# Patient Record
Sex: Female | Born: 1940 | Race: White | Hispanic: No
Health system: Southern US, Community
[De-identification: ages and names within clinical notes are randomized; demographics above are authoritative.]

---

## 1999-04-14 ENCOUNTER — Ambulatory Visit (HOSPITAL_BASED_OUTPATIENT_CLINIC_OR_DEPARTMENT_OTHER): Admission: RE | Admit: 1999-04-14 | Discharge: 1999-04-14 | Payer: Self-pay | Admitting: Orthopedic Surgery

## 1999-05-28 ENCOUNTER — Other Ambulatory Visit: Admission: RE | Admit: 1999-05-28 | Discharge: 1999-05-28 | Payer: Self-pay | Admitting: Family Medicine

## 2000-06-09 ENCOUNTER — Other Ambulatory Visit: Admission: RE | Admit: 2000-06-09 | Discharge: 2000-06-09 | Payer: Self-pay | Admitting: Family Medicine

## 2001-07-10 ENCOUNTER — Other Ambulatory Visit: Admission: RE | Admit: 2001-07-10 | Discharge: 2001-07-10 | Payer: Self-pay | Admitting: Family Medicine

## 2004-06-23 ENCOUNTER — Encounter (INDEPENDENT_AMBULATORY_CARE_PROVIDER_SITE_OTHER): Payer: Self-pay | Admitting: Specialist

## 2004-06-23 ENCOUNTER — Ambulatory Visit (HOSPITAL_COMMUNITY): Admission: RE | Admit: 2004-06-23 | Discharge: 2004-06-24 | Payer: Self-pay | Admitting: Surgery

## 2004-07-20 ENCOUNTER — Ambulatory Visit (HOSPITAL_COMMUNITY): Admission: RE | Admit: 2004-07-20 | Discharge: 2004-07-20 | Payer: Self-pay | Admitting: Gastroenterology

## 2005-08-10 ENCOUNTER — Other Ambulatory Visit: Admission: RE | Admit: 2005-08-10 | Discharge: 2005-08-10 | Payer: Self-pay | Admitting: Family Medicine

## 2006-09-01 ENCOUNTER — Other Ambulatory Visit: Admission: RE | Admit: 2006-09-01 | Discharge: 2006-09-01 | Payer: Self-pay | Admitting: Family Medicine

## 2007-10-30 ENCOUNTER — Other Ambulatory Visit: Admission: RE | Admit: 2007-10-30 | Discharge: 2007-10-30 | Payer: Self-pay | Admitting: Family Medicine

## 2008-09-02 ENCOUNTER — Other Ambulatory Visit: Admission: RE | Admit: 2008-09-02 | Discharge: 2008-09-02 | Payer: Self-pay | Admitting: Family Medicine

## 2010-12-03 NOTE — Op Note (Signed)
NAMEAdalyna, Andrea Payne                 ACCOUNT NO.:  [id number]   MEDICAL RECORD NO.:  [id number]          PATIENT TYPE:  AMB   LOCATION:  ENDO                         FACILITY:  [facility]   PHYSICIAN:  Graylin Shiver, M.D.   DATE OF BIRTH:  [birth year]   DATE OF PROCEDURE:  07/20/2004  DATE OF DISCHARGE:                                 OPERATIVE REPORT   PROCEDURE:  Esophagogastroduodenoscopy with biopsy for CLOtest.   INDICATION FOR PROCEDURE:  This patient is a 70 year old female who feels a  pulse or pounding sensation in the epigastric area.  This has been occurring  for the past three months.  She had recent cholecystectomy; however, this  did not improve the problem.  She still has the pounding sensation in the  epigastric area and a nervous feeling in the epigastric area.  She also had  some heartburn and was placed on Nexium, and this improved the heartburn.  A  CT scan of the abdomen was done on July 16, 2004, which showed  postoperative changes in the right upper quadrant without evidence of  inflammatory process of abscess.  There was a small right renal calculus  without evidence of obstruction.  The liver, spleen, and pancreas all looked  normal.  Endoscopy is being done to evaluate.   Informed consent was obtained after explanation of the risks of bleeding,  infection, and perforation.   PREMEDICATION:  Fentanyl 60 mcg IV, Versed 6 mg IV.   PROCEDURE:  With the patient in the left lateral decubitus position, the  Olympus gastroscope was inserted into the oropharynx and passed into the  esophagus.  It was advanced down the esophagus and then into the stomach and  into the duodenum.  The second portion and bulb of the duodenum were normal.  The stomach showed a mild diffuse erythematous appearance to the mucosa  compatible with gastritis.  No ulcers or erosions were seen.  No lesions  were seen in the fundus or cardia of the stomach.  I inspected for any  obvious  significant pulsations that could be seen, yet I saw none.  The  esophagus looked normal.  She tolerated the procedure well without  complications.   IMPRESSION:  Mild gastritis, biopsy for CLOtest was obtained.   COMMENT:  I see nothing other than gastritis on this upper endoscopy.  I see  nothing specifically on this examination or on reviewing her CT scan to  explain this pulsation-nervous sensation that she feels in the epigastric  area.   There is a right renal calculus without evidence of obstruction based on CT  scan.  I will have her follow up with Dr. Catha Gosselin for further  evaluation.       SFG/MEDQ  D:  07/20/2004  T:  07/20/2004  Job:  [id number]   cc:   Caryn Bee L. Little, M.D.  [address]  [place]  [place] [address]  Fax: [phone]

## 2010-12-03 NOTE — Op Note (Signed)
Andrea Payne, Andrea Payne                 ACCOUNT NO.:  [id number]   MEDICAL RECORD NO.:  [id number]          PATIENT TYPE:  OIB   LOCATION:  NA                           FACILITY:  [facility]   PHYSICIAN:  Currie Paris, M.D.DATE OF BIRTH:  17-Nov-1940   DATE OF PROCEDURE:  DATE OF DISCHARGE:                                 OPERATIVE REPORT   DATE OF OPERATION:  June 23, 2004.   PREOPERATIVE DIAGNOSIS:  Chronic calculous cholecystitis.   POSTOPERATIVE DIAGNOSIS:  Chronic calculous cholecystitis.   PROCEDURE:  Laparoscopic cholecystectomy with operative cholangiogram.   SURGEON:  Currie Paris, MD.   ASSISTANT:  Rose Phi. Young, MD.   ANESTHESIA:  General endotracheal.   INDICATION FOR PROCEDURE:  The patient is a 70 year old who presented last  week with ongoing right upper quadrant pain.  Ultrasound suggested a stone  impacted in the neck of the gallbladder.  After discussion with the patient,  she elected to proceed to cholecystectomy.   DESCRIPTION OF PROCEDURE:  The patient was seen in the holding area, and she  had no further questions.  She was taken to the operating room and after  satisfactory general anesthesia had been obtained, the abdomen was prepped  and draped.   0.25% plain Marcaine was used for each incision.  The umbilical incision was  made, the fascia opened, and the peritoneal cavity entered under direct  vision.  A pursestring was placed, the [place] introduced, and the abdomen  insufflated to 15.   The patient was placed in reverse Trendelenburg and tilted to the right.  Under direct vision, a 1011 trocar was placed in the epigastrium and two 5-  mm trocars in the lateral abdomen.  The gallbladder was retracted over the  liver, and multiple adhesions of omentum taken down.  We noted that the  duodenum was somewhat tethered up onto the gallbladder as well, but this was  gently manipulated off of the gallbladder.  A stone was seen impacted in the  ampulla.   With opening of the peritoneum on either side of the cystic duct, I could  identify the cystic artery coming up actually and laying anterior to the  cystic duct and attached to it.  I had a nice window behind there, so I was  confident of my anatomy and I could see the common duct and a cystic duct  segment and the junction with the gallbladder.   The cystic artery was clipped three times and divided, leaving two clips on  the stay side.  The cystic duct was clipped at its junction with the  gallbladder and then opened.   Using a percutaneous technique, a Cook catheter was introduced into the  cystic duct and held with a clip.  Operative cholangiography showed good  flow and filling of the duodenum, no filling defects in the common duct, and  good filling of the hepatic radicles.   The catheter was removed and two additional clips placed on the cystic duct,  and it was divided.  The gallbladder was removed from below to above with  the  coagulation current of the cautery.  Not mentioned earlier was the fact  that the gallbladder was very distended, and in order to grasp it at the  onset of the case we decompressed it.  White bile was noted in the  gallbladder.   Once the gallbladder was removed, it was placed in a bag, irrigated to make  sure everything was dry, and it appeared completely dry.  The gallbladder  was then brought out through the umbilical port.   The umbilical port was occluded for a few moments while we did a final  irrigation check for hemostasis, and again everything was dry.  Once the  gallbladder was removed, the lateral ports were removed under direct vision.  The pursestring was used to close the umbilical port.  The abdomen was  desufflated through the epigastric port.  The skin was closed with 4-0  Monocryl subcuticular and Dermabond.   The patient tolerated the procedure well.  There were no operative  complications, and all counts were  correct.      Chri   CJS/MEDQ  D:  06/23/2004  T:  06/23/2004  Job:  [id number]   cc:   Caryn Bee L. Little, M.D.  [address]  [place]  [place] [address]  Fax: [phone]

## 2011-09-08 DIAGNOSIS — M899 Disorder of bone, unspecified: Secondary | ICD-10-CM | POA: Diagnosis not present

## 2011-09-08 DIAGNOSIS — Z79899 Other long term (current) drug therapy: Secondary | ICD-10-CM | POA: Diagnosis not present

## 2011-09-08 DIAGNOSIS — R7301 Impaired fasting glucose: Secondary | ICD-10-CM | POA: Diagnosis not present

## 2011-09-08 DIAGNOSIS — E782 Mixed hyperlipidemia: Secondary | ICD-10-CM | POA: Diagnosis not present

## 2011-09-08 DIAGNOSIS — R030 Elevated blood-pressure reading, without diagnosis of hypertension: Secondary | ICD-10-CM | POA: Diagnosis not present

## 2011-09-08 DIAGNOSIS — M949 Disorder of cartilage, unspecified: Secondary | ICD-10-CM | POA: Diagnosis not present

## 2012-01-10 DIAGNOSIS — R7301 Impaired fasting glucose: Secondary | ICD-10-CM | POA: Diagnosis not present

## 2012-01-23 DIAGNOSIS — R071 Chest pain on breathing: Secondary | ICD-10-CM | POA: Diagnosis not present

## 2012-04-27 DIAGNOSIS — Z23 Encounter for immunization: Secondary | ICD-10-CM | POA: Diagnosis not present

## 2012-06-11 DIAGNOSIS — H5210 Myopia, unspecified eye: Secondary | ICD-10-CM | POA: Diagnosis not present

## 2012-06-11 DIAGNOSIS — H524 Presbyopia: Secondary | ICD-10-CM | POA: Diagnosis not present

## 2012-06-11 DIAGNOSIS — H2510 Age-related nuclear cataract, unspecified eye: Secondary | ICD-10-CM | POA: Diagnosis not present

## 2012-06-25 DIAGNOSIS — Z13820 Encounter for screening for osteoporosis: Secondary | ICD-10-CM | POA: Diagnosis not present

## 2012-06-25 DIAGNOSIS — Z1231 Encounter for screening mammogram for malignant neoplasm of breast: Secondary | ICD-10-CM | POA: Diagnosis not present

## 2012-06-25 DIAGNOSIS — Z8262 Family history of osteoporosis: Secondary | ICD-10-CM | POA: Diagnosis not present

## 2012-09-10 DIAGNOSIS — M899 Disorder of bone, unspecified: Secondary | ICD-10-CM | POA: Diagnosis not present

## 2012-09-10 DIAGNOSIS — M949 Disorder of cartilage, unspecified: Secondary | ICD-10-CM | POA: Diagnosis not present

## 2012-09-10 DIAGNOSIS — I10 Essential (primary) hypertension: Secondary | ICD-10-CM | POA: Diagnosis not present

## 2012-09-10 DIAGNOSIS — E782 Mixed hyperlipidemia: Secondary | ICD-10-CM | POA: Diagnosis not present

## 2012-09-10 DIAGNOSIS — R7301 Impaired fasting glucose: Secondary | ICD-10-CM | POA: Diagnosis not present

## 2013-05-06 DIAGNOSIS — Z23 Encounter for immunization: Secondary | ICD-10-CM | POA: Diagnosis not present

## 2013-06-17 DIAGNOSIS — H2510 Age-related nuclear cataract, unspecified eye: Secondary | ICD-10-CM | POA: Diagnosis not present

## 2013-07-22 DIAGNOSIS — Z803 Family history of malignant neoplasm of breast: Secondary | ICD-10-CM | POA: Diagnosis not present

## 2013-07-22 DIAGNOSIS — Z1231 Encounter for screening mammogram for malignant neoplasm of breast: Secondary | ICD-10-CM | POA: Diagnosis not present

## 2013-09-05 DIAGNOSIS — Z86010 Personal history of colonic polyps: Secondary | ICD-10-CM | POA: Diagnosis not present

## 2013-09-05 DIAGNOSIS — Z09 Encounter for follow-up examination after completed treatment for conditions other than malignant neoplasm: Secondary | ICD-10-CM | POA: Diagnosis not present

## 2013-09-20 DIAGNOSIS — M949 Disorder of cartilage, unspecified: Secondary | ICD-10-CM | POA: Diagnosis not present

## 2013-09-20 DIAGNOSIS — R7301 Impaired fasting glucose: Secondary | ICD-10-CM | POA: Diagnosis not present

## 2013-09-20 DIAGNOSIS — Z86010 Personal history of colonic polyps: Secondary | ICD-10-CM | POA: Diagnosis not present

## 2013-09-20 DIAGNOSIS — Z23 Encounter for immunization: Secondary | ICD-10-CM | POA: Diagnosis not present

## 2013-09-20 DIAGNOSIS — E782 Mixed hyperlipidemia: Secondary | ICD-10-CM | POA: Diagnosis not present

## 2013-09-20 DIAGNOSIS — I10 Essential (primary) hypertension: Secondary | ICD-10-CM | POA: Diagnosis not present

## 2013-09-20 DIAGNOSIS — M899 Disorder of bone, unspecified: Secondary | ICD-10-CM | POA: Diagnosis not present

## 2013-10-01 DIAGNOSIS — J069 Acute upper respiratory infection, unspecified: Secondary | ICD-10-CM | POA: Diagnosis not present

## 2013-10-01 DIAGNOSIS — J209 Acute bronchitis, unspecified: Secondary | ICD-10-CM | POA: Diagnosis not present

## 2014-05-13 DIAGNOSIS — Z23 Encounter for immunization: Secondary | ICD-10-CM | POA: Diagnosis not present

## 2014-07-24 DIAGNOSIS — H5213 Myopia, bilateral: Secondary | ICD-10-CM | POA: Diagnosis not present

## 2014-07-24 DIAGNOSIS — H2513 Age-related nuclear cataract, bilateral: Secondary | ICD-10-CM | POA: Diagnosis not present

## 2014-08-20 DIAGNOSIS — Z1231 Encounter for screening mammogram for malignant neoplasm of breast: Secondary | ICD-10-CM | POA: Diagnosis not present

## 2014-08-20 DIAGNOSIS — Z8262 Family history of osteoporosis: Secondary | ICD-10-CM | POA: Diagnosis not present

## 2014-08-20 DIAGNOSIS — Z803 Family history of malignant neoplasm of breast: Secondary | ICD-10-CM | POA: Diagnosis not present

## 2014-09-24 DIAGNOSIS — R7301 Impaired fasting glucose: Secondary | ICD-10-CM | POA: Diagnosis not present

## 2014-09-24 DIAGNOSIS — E782 Mixed hyperlipidemia: Secondary | ICD-10-CM | POA: Diagnosis not present

## 2014-09-24 DIAGNOSIS — Z86010 Personal history of colonic polyps: Secondary | ICD-10-CM | POA: Diagnosis not present

## 2014-09-24 DIAGNOSIS — I10 Essential (primary) hypertension: Secondary | ICD-10-CM | POA: Diagnosis not present

## 2014-09-24 DIAGNOSIS — M8580 Other specified disorders of bone density and structure, unspecified site: Secondary | ICD-10-CM | POA: Diagnosis not present

## 2015-01-20 ENCOUNTER — Telehealth: Payer: Self-pay

## 2015-01-20 NOTE — Telephone Encounter (Signed)
Error wrong chart

## 2015-04-28 DIAGNOSIS — Z23 Encounter for immunization: Secondary | ICD-10-CM | POA: Diagnosis not present

## 2015-07-22 DIAGNOSIS — D2372 Other benign neoplasm of skin of left lower limb, including hip: Secondary | ICD-10-CM | POA: Diagnosis not present

## 2015-07-22 DIAGNOSIS — L821 Other seborrheic keratosis: Secondary | ICD-10-CM | POA: Diagnosis not present

## 2015-07-22 DIAGNOSIS — Z23 Encounter for immunization: Secondary | ICD-10-CM | POA: Diagnosis not present

## 2015-08-10 DIAGNOSIS — H524 Presbyopia: Secondary | ICD-10-CM | POA: Diagnosis not present

## 2015-08-10 DIAGNOSIS — H2513 Age-related nuclear cataract, bilateral: Secondary | ICD-10-CM | POA: Diagnosis not present

## 2015-08-10 DIAGNOSIS — H25013 Cortical age-related cataract, bilateral: Secondary | ICD-10-CM | POA: Diagnosis not present

## 2015-09-16 DIAGNOSIS — Z803 Family history of malignant neoplasm of breast: Secondary | ICD-10-CM | POA: Diagnosis not present

## 2015-09-16 DIAGNOSIS — Z1231 Encounter for screening mammogram for malignant neoplasm of breast: Secondary | ICD-10-CM | POA: Diagnosis not present

## 2015-10-02 ENCOUNTER — Other Ambulatory Visit: Payer: Self-pay | Admitting: Family Medicine

## 2015-10-02 DIAGNOSIS — Z86010 Personal history of colonic polyps: Secondary | ICD-10-CM | POA: Diagnosis not present

## 2015-10-02 DIAGNOSIS — I10 Essential (primary) hypertension: Secondary | ICD-10-CM | POA: Diagnosis not present

## 2015-10-02 DIAGNOSIS — M899 Disorder of bone, unspecified: Secondary | ICD-10-CM | POA: Diagnosis not present

## 2015-10-02 DIAGNOSIS — J309 Allergic rhinitis, unspecified: Secondary | ICD-10-CM | POA: Diagnosis not present

## 2015-10-02 DIAGNOSIS — R7301 Impaired fasting glucose: Secondary | ICD-10-CM | POA: Diagnosis not present

## 2015-10-02 DIAGNOSIS — R938 Abnormal findings on diagnostic imaging of other specified body structures: Secondary | ICD-10-CM | POA: Diagnosis not present

## 2015-10-02 DIAGNOSIS — E782 Mixed hyperlipidemia: Secondary | ICD-10-CM | POA: Diagnosis not present

## 2015-10-02 DIAGNOSIS — R9389 Abnormal findings on diagnostic imaging of other specified body structures: Secondary | ICD-10-CM

## 2015-10-15 ENCOUNTER — Ambulatory Visit
Admission: RE | Admit: 2015-10-15 | Discharge: 2015-10-15 | Disposition: A | Discharge status: Home / Self Care | Payer: Medicare Other | Source: Ambulatory Visit | Attending: Family Medicine | Admitting: Family Medicine

## 2015-10-15 DIAGNOSIS — I6523 Occlusion and stenosis of bilateral carotid arteries: Secondary | ICD-10-CM | POA: Diagnosis not present

## 2015-10-15 DIAGNOSIS — R9389 Abnormal findings on diagnostic imaging of other specified body structures: Secondary | ICD-10-CM

## 2015-11-26 DIAGNOSIS — D485 Neoplasm of uncertain behavior of skin: Secondary | ICD-10-CM | POA: Diagnosis not present

## 2015-11-26 DIAGNOSIS — H02834 Dermatochalasis of left upper eyelid: Secondary | ICD-10-CM | POA: Diagnosis not present

## 2015-11-26 DIAGNOSIS — H02831 Dermatochalasis of right upper eyelid: Secondary | ICD-10-CM | POA: Diagnosis not present

## 2015-12-31 DIAGNOSIS — D485 Neoplasm of uncertain behavior of skin: Secondary | ICD-10-CM | POA: Diagnosis not present

## 2015-12-31 DIAGNOSIS — D210 Benign neoplasm of connective and other soft tissue of head, face and neck: Secondary | ICD-10-CM | POA: Diagnosis not present

## 2015-12-31 DIAGNOSIS — D1801 Hemangioma of skin and subcutaneous tissue: Secondary | ICD-10-CM | POA: Diagnosis not present

## 2016-03-31 DIAGNOSIS — L821 Other seborrheic keratosis: Secondary | ICD-10-CM | POA: Diagnosis not present

## 2016-03-31 DIAGNOSIS — L820 Inflamed seborrheic keratosis: Secondary | ICD-10-CM | POA: Diagnosis not present

## 2016-03-31 DIAGNOSIS — D485 Neoplasm of uncertain behavior of skin: Secondary | ICD-10-CM | POA: Diagnosis not present

## 2016-05-24 DIAGNOSIS — Z23 Encounter for immunization: Secondary | ICD-10-CM | POA: Diagnosis not present

## 2016-09-05 DIAGNOSIS — H2513 Age-related nuclear cataract, bilateral: Secondary | ICD-10-CM | POA: Diagnosis not present

## 2016-09-05 DIAGNOSIS — H524 Presbyopia: Secondary | ICD-10-CM | POA: Diagnosis not present

## 2016-09-05 DIAGNOSIS — H5213 Myopia, bilateral: Secondary | ICD-10-CM | POA: Diagnosis not present

## 2016-10-03 DIAGNOSIS — Z1231 Encounter for screening mammogram for malignant neoplasm of breast: Secondary | ICD-10-CM | POA: Diagnosis not present

## 2016-10-20 DIAGNOSIS — Z8739 Personal history of other diseases of the musculoskeletal system and connective tissue: Secondary | ICD-10-CM | POA: Diagnosis not present

## 2016-10-20 DIAGNOSIS — R7301 Impaired fasting glucose: Secondary | ICD-10-CM | POA: Diagnosis not present

## 2016-10-20 DIAGNOSIS — I10 Essential (primary) hypertension: Secondary | ICD-10-CM | POA: Diagnosis not present

## 2016-10-20 DIAGNOSIS — Z86010 Personal history of colonic polyps: Secondary | ICD-10-CM | POA: Diagnosis not present

## 2016-10-20 DIAGNOSIS — E782 Mixed hyperlipidemia: Secondary | ICD-10-CM | POA: Diagnosis not present

## 2016-10-20 DIAGNOSIS — M899 Disorder of bone, unspecified: Secondary | ICD-10-CM | POA: Diagnosis not present

## 2017-05-09 DIAGNOSIS — I10 Essential (primary) hypertension: Secondary | ICD-10-CM | POA: Diagnosis not present

## 2017-05-09 DIAGNOSIS — Z23 Encounter for immunization: Secondary | ICD-10-CM | POA: Diagnosis not present

## 2017-05-09 DIAGNOSIS — E782 Mixed hyperlipidemia: Secondary | ICD-10-CM | POA: Diagnosis not present

## 2017-05-09 DIAGNOSIS — R7301 Impaired fasting glucose: Secondary | ICD-10-CM | POA: Diagnosis not present

## 2017-05-09 DIAGNOSIS — Z8739 Personal history of other diseases of the musculoskeletal system and connective tissue: Secondary | ICD-10-CM | POA: Diagnosis not present

## 2017-05-09 DIAGNOSIS — M899 Disorder of bone, unspecified: Secondary | ICD-10-CM | POA: Diagnosis not present

## 2017-05-09 DIAGNOSIS — Z86010 Personal history of colonic polyps: Secondary | ICD-10-CM | POA: Diagnosis not present

## 2017-08-21 DIAGNOSIS — R05 Cough: Secondary | ICD-10-CM | POA: Diagnosis not present

## 2017-08-21 DIAGNOSIS — H938X3 Other specified disorders of ear, bilateral: Secondary | ICD-10-CM | POA: Diagnosis not present

## 2017-08-21 DIAGNOSIS — J0110 Acute frontal sinusitis, unspecified: Secondary | ICD-10-CM | POA: Diagnosis not present

## 2017-09-21 DIAGNOSIS — H40023 Open angle with borderline findings, high risk, bilateral: Secondary | ICD-10-CM | POA: Diagnosis not present

## 2017-09-21 DIAGNOSIS — H2513 Age-related nuclear cataract, bilateral: Secondary | ICD-10-CM | POA: Diagnosis not present

## 2017-10-06 DIAGNOSIS — H40023 Open angle with borderline findings, high risk, bilateral: Secondary | ICD-10-CM | POA: Diagnosis not present

## 2017-10-10 DIAGNOSIS — Z1231 Encounter for screening mammogram for malignant neoplasm of breast: Secondary | ICD-10-CM | POA: Diagnosis not present

## 2017-10-25 DIAGNOSIS — L821 Other seborrheic keratosis: Secondary | ICD-10-CM | POA: Diagnosis not present

## 2017-10-25 DIAGNOSIS — D1801 Hemangioma of skin and subcutaneous tissue: Secondary | ICD-10-CM | POA: Diagnosis not present

## 2017-10-25 DIAGNOSIS — L820 Inflamed seborrheic keratosis: Secondary | ICD-10-CM | POA: Diagnosis not present

## 2017-10-25 DIAGNOSIS — L918 Other hypertrophic disorders of the skin: Secondary | ICD-10-CM | POA: Diagnosis not present

## 2017-11-01 DIAGNOSIS — I10 Essential (primary) hypertension: Secondary | ICD-10-CM | POA: Diagnosis not present

## 2017-11-01 DIAGNOSIS — F339 Major depressive disorder, recurrent, unspecified: Secondary | ICD-10-CM | POA: Diagnosis not present

## 2017-11-01 DIAGNOSIS — Z86010 Personal history of colonic polyps: Secondary | ICD-10-CM | POA: Diagnosis not present

## 2017-11-01 DIAGNOSIS — R7301 Impaired fasting glucose: Secondary | ICD-10-CM | POA: Diagnosis not present

## 2017-11-01 DIAGNOSIS — E782 Mixed hyperlipidemia: Secondary | ICD-10-CM | POA: Diagnosis not present

## 2018-02-06 DIAGNOSIS — F339 Major depressive disorder, recurrent, unspecified: Secondary | ICD-10-CM | POA: Diagnosis not present

## 2018-02-06 DIAGNOSIS — I10 Essential (primary) hypertension: Secondary | ICD-10-CM | POA: Diagnosis not present

## 2018-02-06 DIAGNOSIS — Z86010 Personal history of colonic polyps: Secondary | ICD-10-CM | POA: Diagnosis not present

## 2018-02-06 DIAGNOSIS — R7301 Impaired fasting glucose: Secondary | ICD-10-CM | POA: Diagnosis not present

## 2018-02-06 DIAGNOSIS — E782 Mixed hyperlipidemia: Secondary | ICD-10-CM | POA: Diagnosis not present

## 2018-03-20 DIAGNOSIS — B029 Zoster without complications: Secondary | ICD-10-CM | POA: Diagnosis not present

## 2018-03-27 DIAGNOSIS — B029 Zoster without complications: Secondary | ICD-10-CM | POA: Diagnosis not present

## 2018-04-09 DIAGNOSIS — H40023 Open angle with borderline findings, high risk, bilateral: Secondary | ICD-10-CM | POA: Diagnosis not present

## 2018-11-30 DIAGNOSIS — H524 Presbyopia: Secondary | ICD-10-CM | POA: Diagnosis not present

## 2018-11-30 DIAGNOSIS — H2513 Age-related nuclear cataract, bilateral: Secondary | ICD-10-CM | POA: Diagnosis not present

## 2018-11-30 DIAGNOSIS — H5213 Myopia, bilateral: Secondary | ICD-10-CM | POA: Diagnosis not present

## 2018-11-30 DIAGNOSIS — H40023 Open angle with borderline findings, high risk, bilateral: Secondary | ICD-10-CM | POA: Diagnosis not present

## 2019-01-02 DIAGNOSIS — J309 Allergic rhinitis, unspecified: Secondary | ICD-10-CM | POA: Diagnosis not present

## 2019-01-02 DIAGNOSIS — E782 Mixed hyperlipidemia: Secondary | ICD-10-CM | POA: Diagnosis not present

## 2019-01-02 DIAGNOSIS — I10 Essential (primary) hypertension: Secondary | ICD-10-CM | POA: Diagnosis not present

## 2019-01-02 DIAGNOSIS — Z86010 Personal history of colonic polyps: Secondary | ICD-10-CM | POA: Diagnosis not present

## 2019-01-02 DIAGNOSIS — R7301 Impaired fasting glucose: Secondary | ICD-10-CM | POA: Diagnosis not present

## 2019-02-13 DIAGNOSIS — D225 Melanocytic nevi of trunk: Secondary | ICD-10-CM | POA: Diagnosis not present

## 2019-02-13 DIAGNOSIS — L821 Other seborrheic keratosis: Secondary | ICD-10-CM | POA: Diagnosis not present

## 2019-02-13 DIAGNOSIS — D1801 Hemangioma of skin and subcutaneous tissue: Secondary | ICD-10-CM | POA: Diagnosis not present

## 2019-02-13 DIAGNOSIS — L84 Corns and callosities: Secondary | ICD-10-CM | POA: Diagnosis not present

## 2019-02-20 DIAGNOSIS — Z1231 Encounter for screening mammogram for malignant neoplasm of breast: Secondary | ICD-10-CM | POA: Diagnosis not present

## 2019-04-04 DIAGNOSIS — I10 Essential (primary) hypertension: Secondary | ICD-10-CM | POA: Diagnosis not present

## 2019-04-04 DIAGNOSIS — R7301 Impaired fasting glucose: Secondary | ICD-10-CM | POA: Diagnosis not present

## 2019-04-04 DIAGNOSIS — E782 Mixed hyperlipidemia: Secondary | ICD-10-CM | POA: Diagnosis not present

## 2019-04-04 DIAGNOSIS — Z86010 Personal history of colonic polyps: Secondary | ICD-10-CM | POA: Diagnosis not present

## 2019-04-04 DIAGNOSIS — J309 Allergic rhinitis, unspecified: Secondary | ICD-10-CM | POA: Diagnosis not present

## 2019-04-23 DIAGNOSIS — Z23 Encounter for immunization: Secondary | ICD-10-CM | POA: Diagnosis not present

## 2019-06-03 DIAGNOSIS — H40023 Open angle with borderline findings, high risk, bilateral: Secondary | ICD-10-CM | POA: Diagnosis not present

## 2019-09-25 DIAGNOSIS — H6122 Impacted cerumen, left ear: Secondary | ICD-10-CM | POA: Diagnosis not present

## 2019-09-25 DIAGNOSIS — H9311 Tinnitus, right ear: Secondary | ICD-10-CM | POA: Diagnosis not present

## 2019-09-25 DIAGNOSIS — I10 Essential (primary) hypertension: Secondary | ICD-10-CM | POA: Diagnosis not present

## 2019-10-22 DIAGNOSIS — H903 Sensorineural hearing loss, bilateral: Secondary | ICD-10-CM | POA: Diagnosis not present

## 2019-10-22 DIAGNOSIS — H9113 Presbycusis, bilateral: Secondary | ICD-10-CM | POA: Diagnosis not present

## 2019-10-22 DIAGNOSIS — H9311 Tinnitus, right ear: Secondary | ICD-10-CM | POA: Diagnosis not present

## 2019-11-23 DIAGNOSIS — L03116 Cellulitis of left lower limb: Secondary | ICD-10-CM | POA: Diagnosis not present

## 2019-11-23 DIAGNOSIS — W57XXXA Bitten or stung by nonvenomous insect and other nonvenomous arthropods, initial encounter: Secondary | ICD-10-CM | POA: Diagnosis not present

## 2019-11-23 DIAGNOSIS — S70369A Insect bite (nonvenomous), unspecified thigh, initial encounter: Secondary | ICD-10-CM | POA: Diagnosis not present

## 2019-12-26 DIAGNOSIS — H40013 Open angle with borderline findings, low risk, bilateral: Secondary | ICD-10-CM | POA: Diagnosis not present

## 2019-12-26 DIAGNOSIS — H2513 Age-related nuclear cataract, bilateral: Secondary | ICD-10-CM | POA: Diagnosis not present

## 2020-01-30 ENCOUNTER — Other Ambulatory Visit: Payer: Self-pay | Admitting: Family Medicine

## 2020-01-30 DIAGNOSIS — Z86010 Personal history of colonic polyps: Secondary | ICD-10-CM | POA: Diagnosis not present

## 2020-01-30 DIAGNOSIS — Z8739 Personal history of other diseases of the musculoskeletal system and connective tissue: Secondary | ICD-10-CM | POA: Diagnosis not present

## 2020-01-30 DIAGNOSIS — I779 Disorder of arteries and arterioles, unspecified: Secondary | ICD-10-CM | POA: Diagnosis not present

## 2020-01-30 DIAGNOSIS — R7301 Impaired fasting glucose: Secondary | ICD-10-CM | POA: Diagnosis not present

## 2020-01-30 DIAGNOSIS — Z23 Encounter for immunization: Secondary | ICD-10-CM | POA: Diagnosis not present

## 2020-01-30 DIAGNOSIS — E782 Mixed hyperlipidemia: Secondary | ICD-10-CM | POA: Diagnosis not present

## 2020-01-30 DIAGNOSIS — Z0000 Encounter for general adult medical examination without abnormal findings: Secondary | ICD-10-CM | POA: Diagnosis not present

## 2020-01-30 DIAGNOSIS — I10 Essential (primary) hypertension: Secondary | ICD-10-CM | POA: Diagnosis not present

## 2020-02-05 ENCOUNTER — Ambulatory Visit
Admission: RE | Admit: 2020-02-05 | Discharge: 2020-02-05 | Disposition: A | Discharge status: Home / Self Care | Payer: Medicare Other | Source: Ambulatory Visit | Attending: Family Medicine | Admitting: Family Medicine

## 2020-02-05 DIAGNOSIS — I6523 Occlusion and stenosis of bilateral carotid arteries: Secondary | ICD-10-CM | POA: Diagnosis not present

## 2020-02-05 DIAGNOSIS — I779 Disorder of arteries and arterioles, unspecified: Secondary | ICD-10-CM

## 2020-02-05 DIAGNOSIS — I771 Stricture of artery: Secondary | ICD-10-CM | POA: Diagnosis not present

## 2020-03-09 DIAGNOSIS — D1801 Hemangioma of skin and subcutaneous tissue: Secondary | ICD-10-CM | POA: Diagnosis not present

## 2020-03-09 DIAGNOSIS — L821 Other seborrheic keratosis: Secondary | ICD-10-CM | POA: Diagnosis not present

## 2020-03-09 DIAGNOSIS — L918 Other hypertrophic disorders of the skin: Secondary | ICD-10-CM | POA: Diagnosis not present

## 2020-03-09 DIAGNOSIS — D2272 Melanocytic nevi of left lower limb, including hip: Secondary | ICD-10-CM | POA: Diagnosis not present

## 2020-03-25 DIAGNOSIS — Z1231 Encounter for screening mammogram for malignant neoplasm of breast: Secondary | ICD-10-CM | POA: Diagnosis not present

## 2020-03-31 DIAGNOSIS — R0981 Nasal congestion: Secondary | ICD-10-CM | POA: Diagnosis not present

## 2020-03-31 DIAGNOSIS — R5383 Other fatigue: Secondary | ICD-10-CM | POA: Diagnosis not present

## 2020-03-31 DIAGNOSIS — R05 Cough: Secondary | ICD-10-CM | POA: Diagnosis not present

## 2020-04-16 DIAGNOSIS — Z8616 Personal history of COVID-19: Secondary | ICD-10-CM | POA: Diagnosis not present

## 2020-04-27 DIAGNOSIS — Z23 Encounter for immunization: Secondary | ICD-10-CM | POA: Diagnosis not present

## 2020-06-04 DIAGNOSIS — Z23 Encounter for immunization: Secondary | ICD-10-CM | POA: Diagnosis not present

## 2020-08-20 DIAGNOSIS — R7301 Impaired fasting glucose: Secondary | ICD-10-CM | POA: Diagnosis not present

## 2020-08-20 DIAGNOSIS — R059 Cough, unspecified: Secondary | ICD-10-CM | POA: Diagnosis not present

## 2020-08-20 DIAGNOSIS — I779 Disorder of arteries and arterioles, unspecified: Secondary | ICD-10-CM | POA: Diagnosis not present

## 2020-08-20 DIAGNOSIS — Z86010 Personal history of colonic polyps: Secondary | ICD-10-CM | POA: Diagnosis not present

## 2020-08-20 DIAGNOSIS — I10 Essential (primary) hypertension: Secondary | ICD-10-CM | POA: Diagnosis not present

## 2020-08-20 DIAGNOSIS — E782 Mixed hyperlipidemia: Secondary | ICD-10-CM | POA: Diagnosis not present

## 2020-08-20 DIAGNOSIS — Z23 Encounter for immunization: Secondary | ICD-10-CM | POA: Diagnosis not present

## 2020-08-20 DIAGNOSIS — Z8739 Personal history of other diseases of the musculoskeletal system and connective tissue: Secondary | ICD-10-CM | POA: Diagnosis not present

## 2020-08-20 DIAGNOSIS — R5383 Other fatigue: Secondary | ICD-10-CM | POA: Diagnosis not present

## 2020-08-20 DIAGNOSIS — R0981 Nasal congestion: Secondary | ICD-10-CM | POA: Diagnosis not present

## 2020-09-21 DIAGNOSIS — R1310 Dysphagia, unspecified: Secondary | ICD-10-CM | POA: Diagnosis not present

## 2020-09-21 DIAGNOSIS — R140 Abdominal distension (gaseous): Secondary | ICD-10-CM | POA: Diagnosis not present

## 2020-09-21 DIAGNOSIS — Z86010 Personal history of colonic polyps: Secondary | ICD-10-CM | POA: Diagnosis not present

## 2020-10-28 DIAGNOSIS — D122 Benign neoplasm of ascending colon: Secondary | ICD-10-CM | POA: Diagnosis not present

## 2020-10-28 DIAGNOSIS — Z86010 Personal history of colonic polyps: Secondary | ICD-10-CM | POA: Diagnosis not present

## 2020-11-04 DIAGNOSIS — D122 Benign neoplasm of ascending colon: Secondary | ICD-10-CM | POA: Diagnosis not present

## 2020-12-08 DIAGNOSIS — H25813 Combined forms of age-related cataract, bilateral: Secondary | ICD-10-CM | POA: Diagnosis not present

## 2021-02-01 DIAGNOSIS — Z1389 Encounter for screening for other disorder: Secondary | ICD-10-CM | POA: Diagnosis not present

## 2021-02-01 DIAGNOSIS — Z0000 Encounter for general adult medical examination without abnormal findings: Secondary | ICD-10-CM | POA: Diagnosis not present

## 2021-02-02 DIAGNOSIS — I10 Essential (primary) hypertension: Secondary | ICD-10-CM | POA: Diagnosis not present

## 2021-02-02 DIAGNOSIS — R7301 Impaired fasting glucose: Secondary | ICD-10-CM | POA: Diagnosis not present

## 2021-02-02 DIAGNOSIS — S46819A Strain of other muscles, fascia and tendons at shoulder and upper arm level, unspecified arm, initial encounter: Secondary | ICD-10-CM | POA: Diagnosis not present

## 2021-02-02 DIAGNOSIS — E782 Mixed hyperlipidemia: Secondary | ICD-10-CM | POA: Diagnosis not present

## 2021-02-02 DIAGNOSIS — I779 Disorder of arteries and arterioles, unspecified: Secondary | ICD-10-CM | POA: Diagnosis not present

## 2021-02-02 DIAGNOSIS — H6990 Unspecified Eustachian tube disorder, unspecified ear: Secondary | ICD-10-CM | POA: Diagnosis not present

## 2021-02-16 DIAGNOSIS — L821 Other seborrheic keratosis: Secondary | ICD-10-CM | POA: Diagnosis not present

## 2021-02-16 DIAGNOSIS — D485 Neoplasm of uncertain behavior of skin: Secondary | ICD-10-CM | POA: Diagnosis not present

## 2021-03-16 DIAGNOSIS — L821 Other seborrheic keratosis: Secondary | ICD-10-CM | POA: Diagnosis not present

## 2021-03-16 DIAGNOSIS — D1801 Hemangioma of skin and subcutaneous tissue: Secondary | ICD-10-CM | POA: Diagnosis not present

## 2021-03-16 DIAGNOSIS — L570 Actinic keratosis: Secondary | ICD-10-CM | POA: Diagnosis not present

## 2021-04-13 DIAGNOSIS — M25511 Pain in right shoulder: Secondary | ICD-10-CM | POA: Diagnosis not present

## 2021-05-15 DIAGNOSIS — Z23 Encounter for immunization: Secondary | ICD-10-CM | POA: Diagnosis not present

## 2021-05-27 DIAGNOSIS — Z1231 Encounter for screening mammogram for malignant neoplasm of breast: Secondary | ICD-10-CM | POA: Diagnosis not present

## 2021-05-27 DIAGNOSIS — Z780 Asymptomatic menopausal state: Secondary | ICD-10-CM | POA: Diagnosis not present

## 2021-08-05 DIAGNOSIS — R7301 Impaired fasting glucose: Secondary | ICD-10-CM | POA: Diagnosis not present

## 2021-10-08 IMAGING — US US CAROTID DUPLEX BILAT
1 series · 13 of 24 positions shown · non-contrast
Comparison: 10/15/2015

CLINICAL DATA: 78-year-old female with a history of carotid artery
disease

EXAM:
BILATERAL CAROTID DUPLEX ULTRASOUND
TECHNIQUE: Gray scale imaging, color Doppler and duplex ultrasound were
performed of bilateral carotid and vertebral arteries in the neck.

[Series 1: us carotid duplex bilat · 0.06mm/px · 13 of 58 slices shown]
[im 1/58]
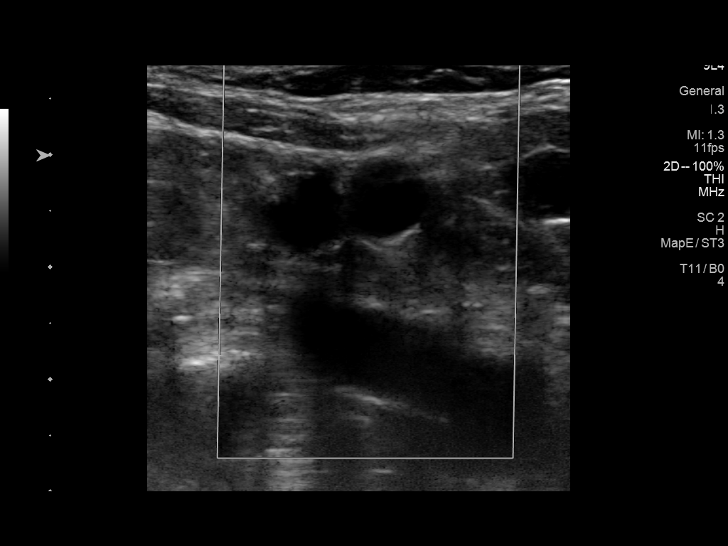
[im 5/58]
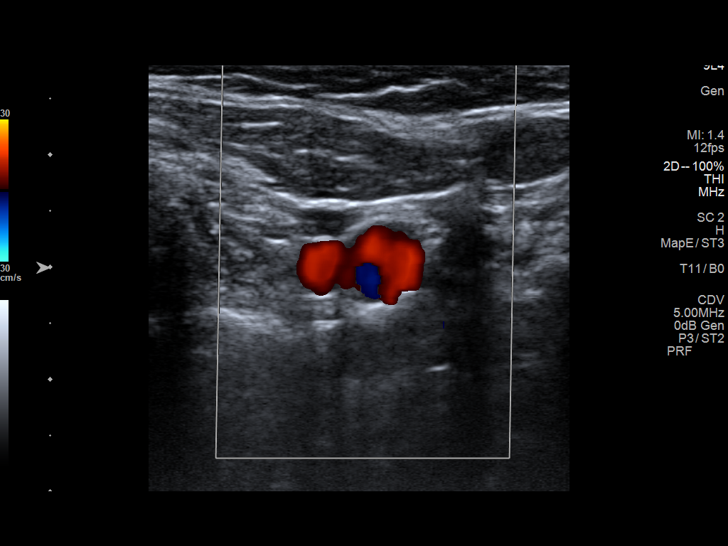
[im 10/58]
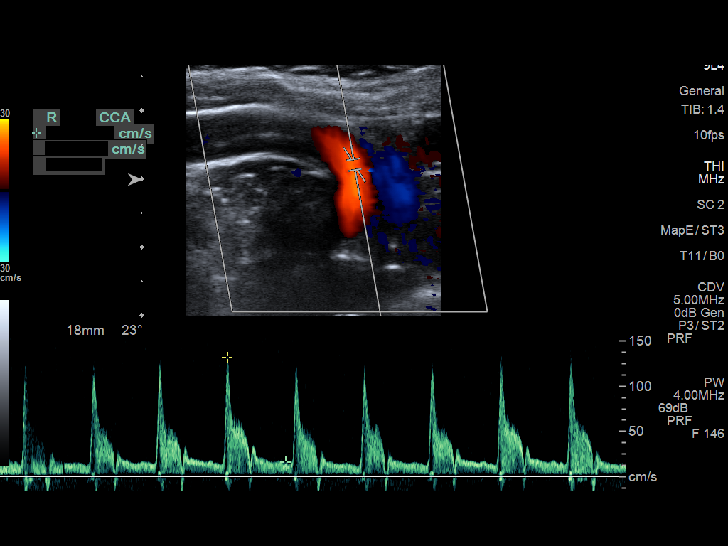
[im 15/58]
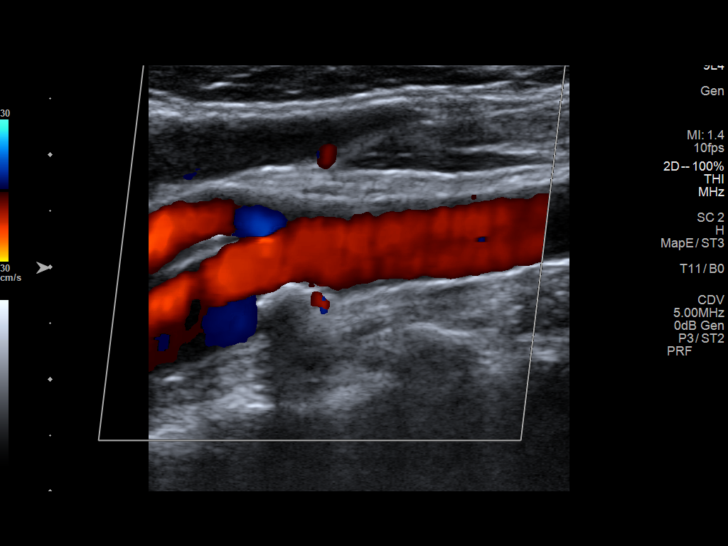
[im 20/58]
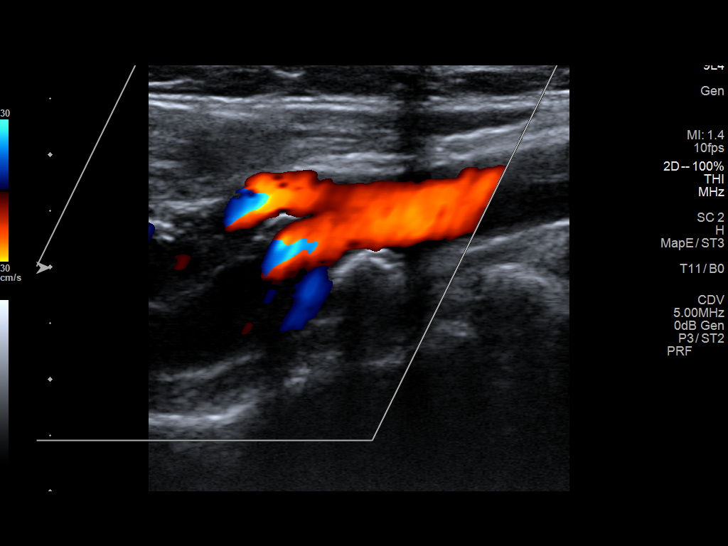
[im 25/58]
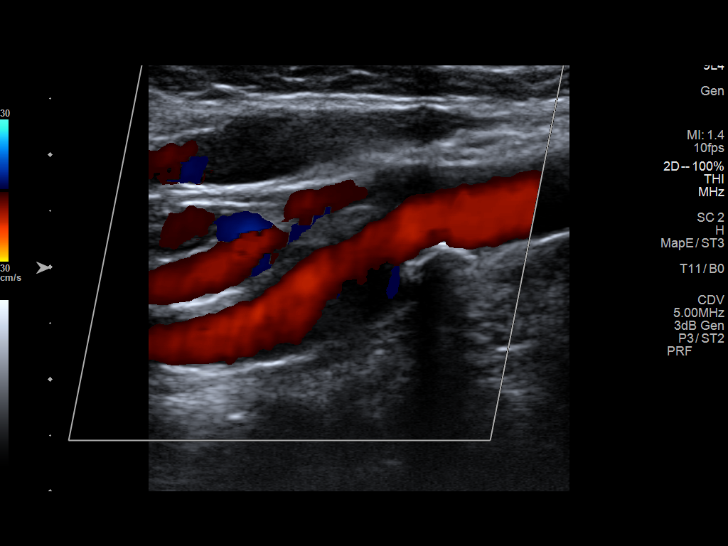
[im 30/58]
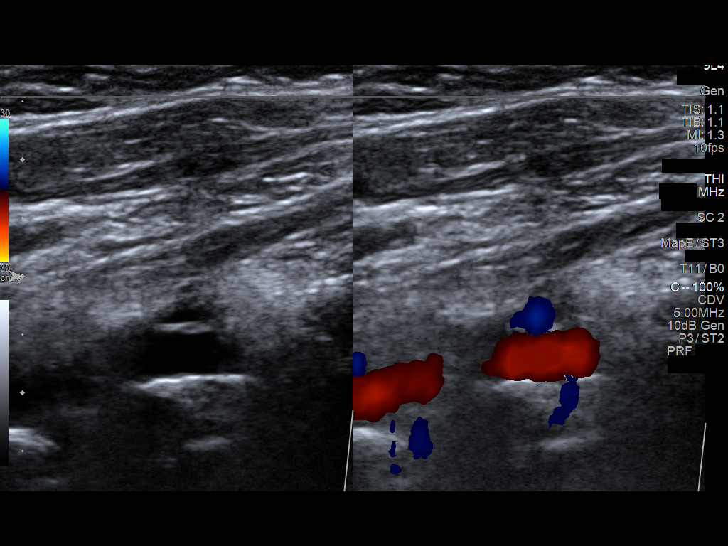
[im 33/58]
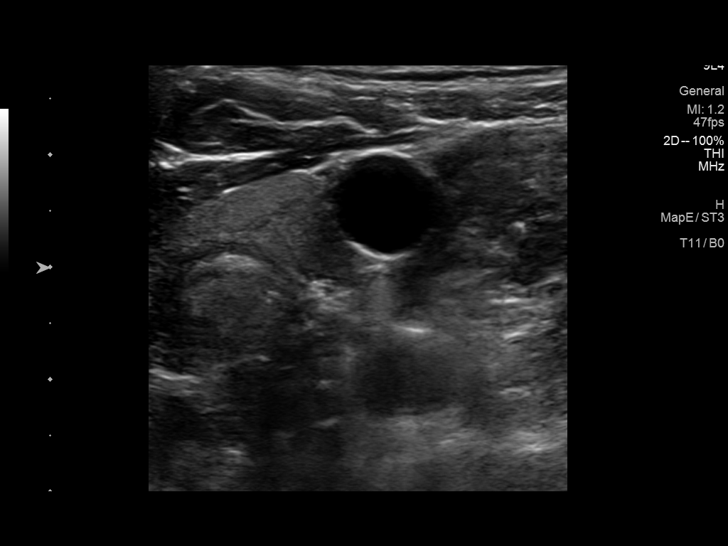
[im 38/58]
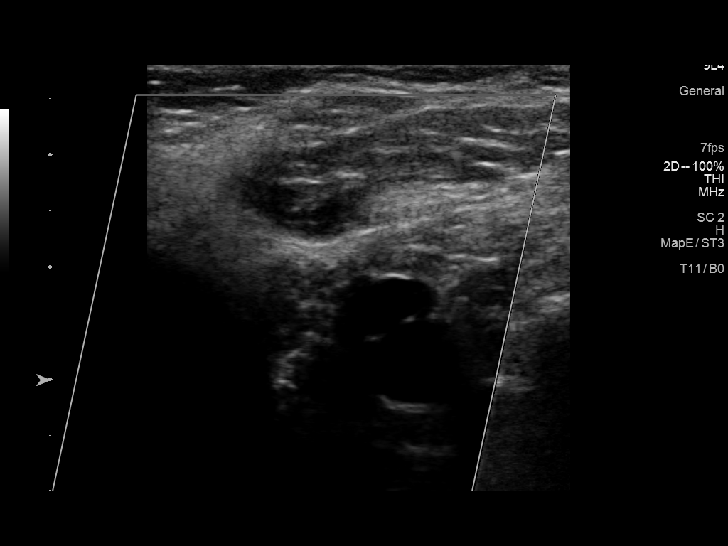
[im 43/58]
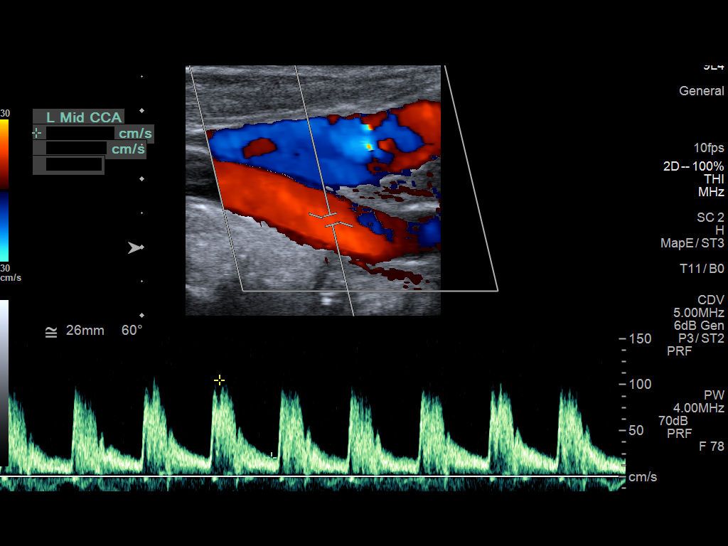
[im 48/58]
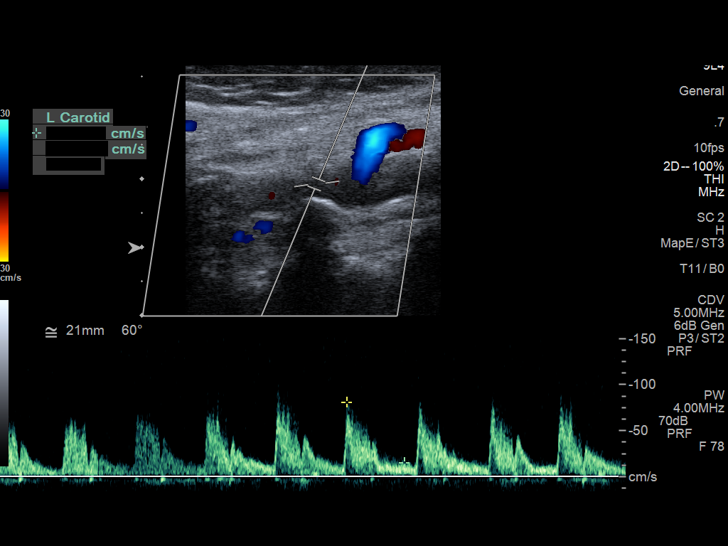
[im 53/58]
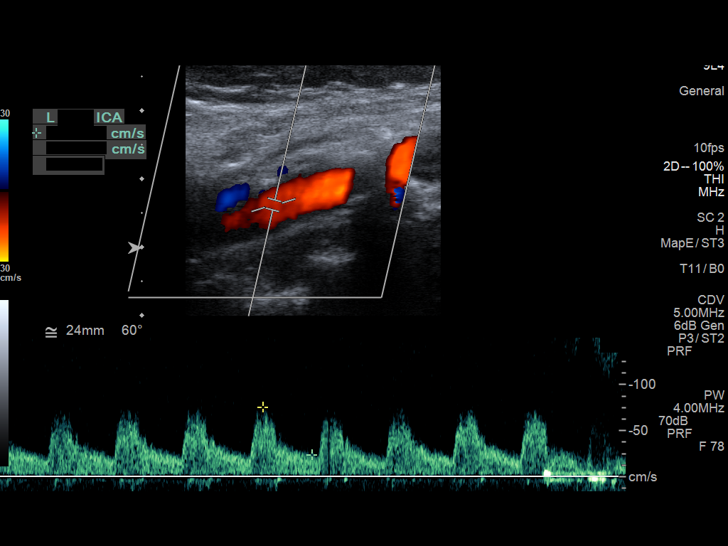
[im 58/58]
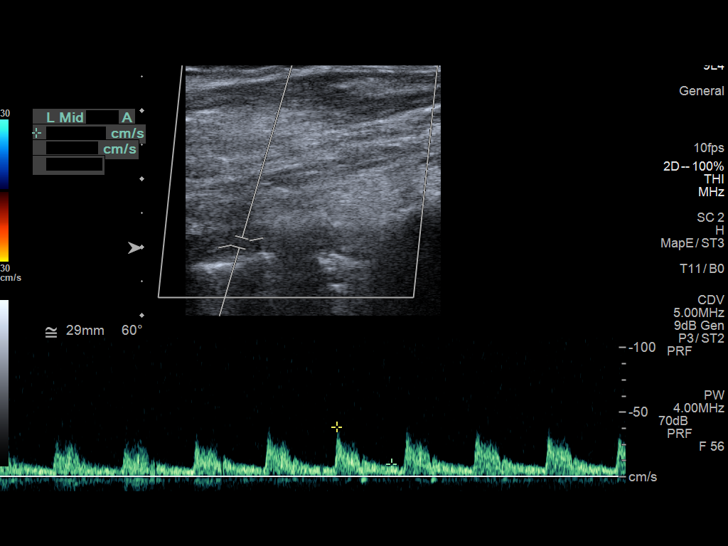

[13 of 24 positions shown; findings below may reference images not displayed]

FINDINGS: Criteria: Quantification of carotid stenosis is based on velocity
parameters that correlate the residual internal carotid diameter
with NASCET-based stenosis levels, using the diameter of the distal
internal carotid lumen as the denominator for stenosis measurement.

The following velocity measurements were obtained:

RIGHT

ICA:  Systolic 107 cm/sec, Diastolic 21 cm/sec

CCA:  145 cm/sec

SYSTOLIC ICA/CCA RATIO:

ECA:  91 cm/sec

LEFT

ICA:  Systolic 87 cm/sec, Diastolic 23 cm/sec

CCA:  105 cm/sec

SYSTOLIC ICA/CCA RATIO:

ECA:  90 cm/sec

Right Brachial SBP: 170

Left Brachial SBP: 176

RIGHT CAROTID ARTERY: No significant calcifications of the right
common carotid artery. Intermediate waveform maintained.
Heterogeneous and partially calcified plaque at the right carotid
bifurcation. No significant lumen shadowing. Low resistance waveform
of the right ICA. Tortuosity

RIGHT VERTEBRAL ARTERY: Antegrade flow with low resistance waveform.

LEFT CAROTID ARTERY: No significant calcifications of the left
common carotid artery. Intermediate waveform maintained.
Heterogeneous and partially calcified plaque at the left carotid
bifurcation without significant lumen shadowing. Low resistance
waveform of the left ICA. Tortuosity

LEFT VERTEBRAL ARTERY:  Antegrade flow with low resistance waveform.
IMPRESSION: Color duplex indicates minimal heterogeneous and calcified plaque,
with no hemodynamically significant stenosis by duplex criteria in
the extracranial cerebrovascular circulation.

## 2021-10-26 DIAGNOSIS — I10 Essential (primary) hypertension: Secondary | ICD-10-CM | POA: Diagnosis not present

## 2021-10-26 DIAGNOSIS — R7301 Impaired fasting glucose: Secondary | ICD-10-CM | POA: Diagnosis not present

## 2021-10-26 DIAGNOSIS — E782 Mixed hyperlipidemia: Secondary | ICD-10-CM | POA: Diagnosis not present

## 2021-10-26 DIAGNOSIS — R7303 Prediabetes: Secondary | ICD-10-CM | POA: Diagnosis not present

## 2021-11-23 DIAGNOSIS — H40013 Open angle with borderline findings, low risk, bilateral: Secondary | ICD-10-CM | POA: Diagnosis not present

## 2021-11-23 DIAGNOSIS — R7303 Prediabetes: Secondary | ICD-10-CM | POA: Diagnosis not present

## 2021-11-23 DIAGNOSIS — H25813 Combined forms of age-related cataract, bilateral: Secondary | ICD-10-CM | POA: Diagnosis not present

## 2022-02-03 DIAGNOSIS — R7303 Prediabetes: Secondary | ICD-10-CM | POA: Diagnosis not present

## 2022-02-03 DIAGNOSIS — Z0000 Encounter for general adult medical examination without abnormal findings: Secondary | ICD-10-CM | POA: Diagnosis not present

## 2022-02-03 DIAGNOSIS — F339 Major depressive disorder, recurrent, unspecified: Secondary | ICD-10-CM | POA: Diagnosis not present

## 2022-02-03 DIAGNOSIS — F411 Generalized anxiety disorder: Secondary | ICD-10-CM | POA: Diagnosis not present

## 2022-02-03 DIAGNOSIS — Z86010 Personal history of colonic polyps: Secondary | ICD-10-CM | POA: Diagnosis not present

## 2022-02-03 DIAGNOSIS — E782 Mixed hyperlipidemia: Secondary | ICD-10-CM | POA: Diagnosis not present

## 2022-02-03 DIAGNOSIS — I10 Essential (primary) hypertension: Secondary | ICD-10-CM | POA: Diagnosis not present

## 2022-02-03 DIAGNOSIS — K219 Gastro-esophageal reflux disease without esophagitis: Secondary | ICD-10-CM | POA: Diagnosis not present

## 2022-02-03 DIAGNOSIS — I779 Disorder of arteries and arterioles, unspecified: Secondary | ICD-10-CM | POA: Diagnosis not present

## 2022-04-25 DIAGNOSIS — D2272 Melanocytic nevi of left lower limb, including hip: Secondary | ICD-10-CM | POA: Diagnosis not present

## 2022-04-25 DIAGNOSIS — L821 Other seborrheic keratosis: Secondary | ICD-10-CM | POA: Diagnosis not present

## 2022-05-03 DIAGNOSIS — Z23 Encounter for immunization: Secondary | ICD-10-CM | POA: Diagnosis not present

## 2022-05-31 DIAGNOSIS — Z1231 Encounter for screening mammogram for malignant neoplasm of breast: Secondary | ICD-10-CM | POA: Diagnosis not present

## 2022-07-14 DIAGNOSIS — H11441 Conjunctival cysts, right eye: Secondary | ICD-10-CM | POA: Diagnosis not present

## 2022-08-03 DIAGNOSIS — Z6823 Body mass index (BMI) 23.0-23.9, adult: Secondary | ICD-10-CM | POA: Diagnosis not present

## 2022-08-03 DIAGNOSIS — F411 Generalized anxiety disorder: Secondary | ICD-10-CM | POA: Diagnosis not present

## 2022-08-03 DIAGNOSIS — R7303 Prediabetes: Secondary | ICD-10-CM | POA: Diagnosis not present

## 2022-08-03 DIAGNOSIS — K219 Gastro-esophageal reflux disease without esophagitis: Secondary | ICD-10-CM | POA: Diagnosis not present

## 2022-08-03 DIAGNOSIS — F339 Major depressive disorder, recurrent, unspecified: Secondary | ICD-10-CM | POA: Diagnosis not present

## 2022-08-03 DIAGNOSIS — I10 Essential (primary) hypertension: Secondary | ICD-10-CM | POA: Diagnosis not present

## 2022-08-03 DIAGNOSIS — E782 Mixed hyperlipidemia: Secondary | ICD-10-CM | POA: Diagnosis not present

## 2022-08-03 DIAGNOSIS — Z79899 Other long term (current) drug therapy: Secondary | ICD-10-CM | POA: Diagnosis not present

## 2022-08-03 DIAGNOSIS — I779 Disorder of arteries and arterioles, unspecified: Secondary | ICD-10-CM | POA: Diagnosis not present

## 2022-09-06 DIAGNOSIS — Z6824 Body mass index (BMI) 24.0-24.9, adult: Secondary | ICD-10-CM | POA: Diagnosis not present

## 2022-09-06 DIAGNOSIS — Z79899 Other long term (current) drug therapy: Secondary | ICD-10-CM | POA: Diagnosis not present

## 2022-09-06 DIAGNOSIS — D485 Neoplasm of uncertain behavior of skin: Secondary | ICD-10-CM | POA: Diagnosis not present

## 2022-09-06 DIAGNOSIS — I10 Essential (primary) hypertension: Secondary | ICD-10-CM | POA: Diagnosis not present

## 2022-09-06 DIAGNOSIS — B078 Other viral warts: Secondary | ICD-10-CM | POA: Diagnosis not present

## 2022-09-06 DIAGNOSIS — L821 Other seborrheic keratosis: Secondary | ICD-10-CM | POA: Diagnosis not present

## 2022-11-22 DIAGNOSIS — H25813 Combined forms of age-related cataract, bilateral: Secondary | ICD-10-CM | POA: Diagnosis not present

## 2022-11-22 DIAGNOSIS — H5213 Myopia, bilateral: Secondary | ICD-10-CM | POA: Diagnosis not present

## 2022-11-22 DIAGNOSIS — H524 Presbyopia: Secondary | ICD-10-CM | POA: Diagnosis not present

## 2022-11-22 DIAGNOSIS — H52203 Unspecified astigmatism, bilateral: Secondary | ICD-10-CM | POA: Diagnosis not present

## 2022-11-22 DIAGNOSIS — H40003 Preglaucoma, unspecified, bilateral: Secondary | ICD-10-CM | POA: Diagnosis not present

## 2023-02-06 DIAGNOSIS — Z6824 Body mass index (BMI) 24.0-24.9, adult: Secondary | ICD-10-CM | POA: Diagnosis not present

## 2023-02-06 DIAGNOSIS — Z0000 Encounter for general adult medical examination without abnormal findings: Secondary | ICD-10-CM | POA: Diagnosis not present

## 2023-03-14 DIAGNOSIS — Z6824 Body mass index (BMI) 24.0-24.9, adult: Secondary | ICD-10-CM | POA: Diagnosis not present

## 2023-03-14 DIAGNOSIS — M791 Myalgia, unspecified site: Secondary | ICD-10-CM | POA: Diagnosis not present

## 2023-03-14 DIAGNOSIS — E782 Mixed hyperlipidemia: Secondary | ICD-10-CM | POA: Diagnosis not present

## 2023-03-14 DIAGNOSIS — F411 Generalized anxiety disorder: Secondary | ICD-10-CM | POA: Diagnosis not present

## 2023-03-14 DIAGNOSIS — E1159 Type 2 diabetes mellitus with other circulatory complications: Secondary | ICD-10-CM | POA: Diagnosis not present

## 2023-03-14 DIAGNOSIS — I10 Essential (primary) hypertension: Secondary | ICD-10-CM | POA: Diagnosis not present

## 2023-05-08 DIAGNOSIS — L820 Inflamed seborrheic keratosis: Secondary | ICD-10-CM | POA: Diagnosis not present

## 2023-05-08 DIAGNOSIS — D485 Neoplasm of uncertain behavior of skin: Secondary | ICD-10-CM | POA: Diagnosis not present

## 2023-05-16 DIAGNOSIS — Z23 Encounter for immunization: Secondary | ICD-10-CM | POA: Diagnosis not present

## 2023-06-05 DIAGNOSIS — Z1231 Encounter for screening mammogram for malignant neoplasm of breast: Secondary | ICD-10-CM | POA: Diagnosis not present

## 2023-09-13 DIAGNOSIS — M5441 Lumbago with sciatica, right side: Secondary | ICD-10-CM | POA: Diagnosis not present

## 2023-09-13 DIAGNOSIS — E782 Mixed hyperlipidemia: Secondary | ICD-10-CM | POA: Diagnosis not present

## 2023-09-13 DIAGNOSIS — E1159 Type 2 diabetes mellitus with other circulatory complications: Secondary | ICD-10-CM | POA: Diagnosis not present

## 2023-09-13 DIAGNOSIS — M5442 Lumbago with sciatica, left side: Secondary | ICD-10-CM | POA: Diagnosis not present

## 2023-09-13 DIAGNOSIS — I10 Essential (primary) hypertension: Secondary | ICD-10-CM | POA: Diagnosis not present

## 2023-09-13 DIAGNOSIS — G8929 Other chronic pain: Secondary | ICD-10-CM | POA: Diagnosis not present

## 2023-10-13 DIAGNOSIS — I10 Essential (primary) hypertension: Secondary | ICD-10-CM | POA: Diagnosis not present

## 2023-11-02 DIAGNOSIS — L918 Other hypertrophic disorders of the skin: Secondary | ICD-10-CM | POA: Diagnosis not present

## 2023-11-02 DIAGNOSIS — L570 Actinic keratosis: Secondary | ICD-10-CM | POA: Diagnosis not present

## 2023-11-02 DIAGNOSIS — D2272 Melanocytic nevi of left lower limb, including hip: Secondary | ICD-10-CM | POA: Diagnosis not present

## 2023-11-02 DIAGNOSIS — L821 Other seborrheic keratosis: Secondary | ICD-10-CM | POA: Diagnosis not present

## 2023-11-27 DIAGNOSIS — H47393 Other disorders of optic disc, bilateral: Secondary | ICD-10-CM | POA: Diagnosis not present

## 2023-11-27 DIAGNOSIS — H524 Presbyopia: Secondary | ICD-10-CM | POA: Diagnosis not present

## 2023-11-27 DIAGNOSIS — H5213 Myopia, bilateral: Secondary | ICD-10-CM | POA: Diagnosis not present

## 2023-11-27 DIAGNOSIS — H52203 Unspecified astigmatism, bilateral: Secondary | ICD-10-CM | POA: Diagnosis not present

## 2023-11-27 DIAGNOSIS — H25013 Cortical age-related cataract, bilateral: Secondary | ICD-10-CM | POA: Diagnosis not present

## 2023-11-27 DIAGNOSIS — H2513 Age-related nuclear cataract, bilateral: Secondary | ICD-10-CM | POA: Diagnosis not present

## 2023-11-27 DIAGNOSIS — H40003 Preglaucoma, unspecified, bilateral: Secondary | ICD-10-CM | POA: Diagnosis not present

## 2024-02-02 DIAGNOSIS — M419 Scoliosis, unspecified: Secondary | ICD-10-CM | POA: Diagnosis not present

## 2024-02-02 DIAGNOSIS — M5135 Other intervertebral disc degeneration, thoracolumbar region: Secondary | ICD-10-CM | POA: Diagnosis not present

## 2024-02-02 DIAGNOSIS — M48061 Spinal stenosis, lumbar region without neurogenic claudication: Secondary | ICD-10-CM | POA: Diagnosis not present

## 2024-02-02 DIAGNOSIS — M51369 Other intervertebral disc degeneration, lumbar region without mention of lumbar back pain or lower extremity pain: Secondary | ICD-10-CM | POA: Diagnosis not present

## 2024-02-02 DIAGNOSIS — M4804 Spinal stenosis, thoracic region: Secondary | ICD-10-CM | POA: Diagnosis not present

## 2024-02-02 DIAGNOSIS — M79605 Pain in left leg: Secondary | ICD-10-CM | POA: Diagnosis not present

## 2024-02-02 DIAGNOSIS — M439 Deforming dorsopathy, unspecified: Secondary | ICD-10-CM | POA: Diagnosis not present

## 2024-02-02 DIAGNOSIS — M549 Dorsalgia, unspecified: Secondary | ICD-10-CM | POA: Diagnosis not present

## 2024-02-02 DIAGNOSIS — M47814 Spondylosis without myelopathy or radiculopathy, thoracic region: Secondary | ICD-10-CM | POA: Diagnosis not present

## 2024-02-02 DIAGNOSIS — M47816 Spondylosis without myelopathy or radiculopathy, lumbar region: Secondary | ICD-10-CM | POA: Diagnosis not present

## 2024-02-02 DIAGNOSIS — M79604 Pain in right leg: Secondary | ICD-10-CM | POA: Diagnosis not present

## 2024-02-07 DIAGNOSIS — Z0000 Encounter for general adult medical examination without abnormal findings: Secondary | ICD-10-CM | POA: Diagnosis not present

## 2024-03-11 DIAGNOSIS — I10 Essential (primary) hypertension: Secondary | ICD-10-CM | POA: Diagnosis not present

## 2024-03-11 DIAGNOSIS — M5135 Other intervertebral disc degeneration, thoracolumbar region: Secondary | ICD-10-CM | POA: Diagnosis not present

## 2024-03-11 DIAGNOSIS — E1159 Type 2 diabetes mellitus with other circulatory complications: Secondary | ICD-10-CM | POA: Diagnosis not present

## 2024-03-11 DIAGNOSIS — Z0000 Encounter for general adult medical examination without abnormal findings: Secondary | ICD-10-CM | POA: Diagnosis not present

## 2024-03-11 DIAGNOSIS — K219 Gastro-esophageal reflux disease without esophagitis: Secondary | ICD-10-CM | POA: Diagnosis not present

## 2024-03-11 DIAGNOSIS — I779 Disorder of arteries and arterioles, unspecified: Secondary | ICD-10-CM | POA: Diagnosis not present

## 2024-03-26 DIAGNOSIS — G8929 Other chronic pain: Secondary | ICD-10-CM | POA: Diagnosis not present

## 2024-03-26 DIAGNOSIS — M545 Low back pain, unspecified: Secondary | ICD-10-CM | POA: Diagnosis not present

## 2024-04-02 DIAGNOSIS — R29898 Other symptoms and signs involving the musculoskeletal system: Secondary | ICD-10-CM | POA: Diagnosis not present

## 2024-04-02 DIAGNOSIS — M539 Dorsopathy, unspecified: Secondary | ICD-10-CM | POA: Diagnosis not present

## 2024-04-09 DIAGNOSIS — M539 Dorsopathy, unspecified: Secondary | ICD-10-CM | POA: Diagnosis not present

## 2024-04-09 DIAGNOSIS — R29898 Other symptoms and signs involving the musculoskeletal system: Secondary | ICD-10-CM | POA: Diagnosis not present

## 2024-04-16 DIAGNOSIS — R29898 Other symptoms and signs involving the musculoskeletal system: Secondary | ICD-10-CM | POA: Diagnosis not present

## 2024-04-16 DIAGNOSIS — M539 Dorsopathy, unspecified: Secondary | ICD-10-CM | POA: Diagnosis not present

## 2024-04-23 DIAGNOSIS — R29898 Other symptoms and signs involving the musculoskeletal system: Secondary | ICD-10-CM | POA: Diagnosis not present

## 2024-04-23 DIAGNOSIS — M539 Dorsopathy, unspecified: Secondary | ICD-10-CM | POA: Diagnosis not present

## 2024-04-24 DIAGNOSIS — Z23 Encounter for immunization: Secondary | ICD-10-CM | POA: Diagnosis not present

## 2024-04-30 DIAGNOSIS — R29898 Other symptoms and signs involving the musculoskeletal system: Secondary | ICD-10-CM | POA: Diagnosis not present

## 2024-04-30 DIAGNOSIS — M539 Dorsopathy, unspecified: Secondary | ICD-10-CM | POA: Diagnosis not present

## 2024-05-07 DIAGNOSIS — R29898 Other symptoms and signs involving the musculoskeletal system: Secondary | ICD-10-CM | POA: Diagnosis not present

## 2024-05-07 DIAGNOSIS — M539 Dorsopathy, unspecified: Secondary | ICD-10-CM | POA: Diagnosis not present

## 2024-05-14 DIAGNOSIS — M539 Dorsopathy, unspecified: Secondary | ICD-10-CM | POA: Diagnosis not present

## 2024-05-14 DIAGNOSIS — R29898 Other symptoms and signs involving the musculoskeletal system: Secondary | ICD-10-CM | POA: Diagnosis not present
